# Patient Record
Sex: Male | Born: 1995 | Race: Black or African American | Hispanic: No | Marital: Single | Smoking: Current some day smoker
Health system: Southern US, Community
[De-identification: ages and names within clinical notes are randomized; demographics above are authoritative.]

---
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
## 2016-08-21 ENCOUNTER — Emergency Department (HOSPITAL_COMMUNITY)
Admission: EM | Admit: 2016-08-21 | Discharge: 2016-08-21 | Disposition: A | Discharge status: Home / Self Care | Payer: Medicaid Other | Attending: Emergency Medicine | Admitting: Emergency Medicine

## 2016-08-21 ENCOUNTER — Encounter (HOSPITAL_COMMUNITY): Payer: Self-pay

## 2016-08-21 DIAGNOSIS — J4521 Mild intermittent asthma with (acute) exacerbation: Secondary | ICD-10-CM

## 2016-08-21 DIAGNOSIS — F17290 Nicotine dependence, other tobacco product, uncomplicated: Secondary | ICD-10-CM | POA: Insufficient documentation

## 2016-08-21 MED ORDER — IPRATROPIUM BROMIDE 0.02 % IN SOLN
0.5000 mg | Freq: Once | RESPIRATORY_TRACT | Status: AC
  Administered 2016-08-21: 0.5 mg via RESPIRATORY_TRACT
  Filled 2016-08-21: qty 2.5

## 2016-08-21 MED ORDER — ALBUTEROL SULFATE (2.5 MG/3ML) 0.083% IN NEBU: 2.5000 mg | INHALATION_SOLUTION | Freq: Once | RESPIRATORY_TRACT | Status: DC

## 2016-08-21 MED ORDER — LORATADINE 10 MG PO TABS: 10.0000 mg | ORAL_TABLET | Freq: Once | ORAL | Status: DC

## 2016-08-21 MED ORDER — ALBUTEROL SULFATE (2.5 MG/3ML) 0.083% IN NEBU
5.0000 mg | INHALATION_SOLUTION | Freq: Once | RESPIRATORY_TRACT | Status: AC
  Administered 2016-08-21: 5 mg via RESPIRATORY_TRACT
  Filled 2016-08-21 (×2): qty 6

## 2016-08-21 MED ORDER — CETIRIZINE HCL 10 MG PO TABS: 10.0000 mg | ORAL_TABLET | Freq: Every day | ORAL | 1 refills | Status: AC

## 2016-08-21 MED ORDER — ALBUTEROL SULFATE HFA 108 (90 BASE) MCG/ACT IN AERS: 1.0000 | INHALATION_SPRAY | Freq: Four times a day (QID) | RESPIRATORY_TRACT | 0 refills | Status: DC | PRN

## 2016-08-21 MED ORDER — ALBUTEROL SULFATE HFA 108 (90 BASE) MCG/ACT IN AERS
2.0000 | INHALATION_SPRAY | Freq: Once | RESPIRATORY_TRACT | Status: AC
  Administered 2016-08-21: 2 via RESPIRATORY_TRACT
  Filled 2016-08-21: qty 6.7

## 2016-08-21 NOTE — ED Provider Notes (Signed)
[facility]-EMERGENCY DEPT Provider Note   CSN: [id number] Arrival date & time: 08/21/16  0436     History   Chief Complaint Chief Complaint  Patient presents with  . Asthma    HPI Oscar ShirkMark A Gentz is a 20 y.o. male.  HPI  Patient is a 20 year old male with history of asthma who presents emergency department for asthma exacerbation which began 1 hour prior to arrival in the ER. He is out of his home medication. He complained of wheezes and tightness which he feels in his back.  He feels mildly short of breath.  He has cough with clear to white sputum production has been intermittent for the past 1-2 weeks. He denies fever, chills, sweats.  His symptoms feel like an asthma exacerbation.  He states he is triggered by different seasonal allergies and sometimes has worse asthma during winter months.  No other complaints.   Past Medical History:  Diagnosis Date  . Asthma     There are no active problems to display for this patient.   History reviewed. No pertinent surgical history.     Home Medications    Prior to Admission medications   Medication Sig Start Date End Date Taking? Authorizing Provider  albuterol (PROVENTIL HFA;VENTOLIN HFA) 108 (90 Base) MCG/ACT inhaler Inhale 1-2 puffs into the lungs every 6 (six) hours as needed for wheezing or shortness of breath. 08/21/16   Danelle BerryLeisa Plumer Mittelstaedt, PA-C  albuterol (PROVENTIL) (2.5 MG/3ML) 0.083% nebulizer solution Take 2.5 mg by nebulization every 4 (four) hours as needed. For shortness of breath 01/12/12 01/11/13  Viviano SimasLauren Robinson, NP  albuterol (PROVENTIL) (2.5 MG/3ML) 0.083% nebulizer solution Take 3 mLs (2.5 mg total) by nebulization every 4 (four) hours as needed for wheezing. 08/21/12 08/21/13  Marcellina Millinimothy Galey, MD  cetirizine (ZYRTEC ALLERGY) 10 MG tablet Take 1 tablet (10 mg total) by mouth daily. 08/21/16   Danelle BerryLeisa Urban Naval, PA-C  predniSONE (DELTASONE) 10 MG tablet Take 6 tablets (60 mg total) by mouth daily. 09/16/15   Azalia BilisKevin Campos, MD    Family  History No family history on file.  Social History Social History  Substance Use Topics  . Smoking status: Current Some Day Smoker    Types: Cigars  . Smokeless tobacco: Not on file  . Alcohol use No     Allergies   Patient has no known allergies.   Review of Systems Review of Systems  All other systems reviewed and are negative.    Physical Exam Updated Vital Signs BP 141/70   Pulse 69   Temp 97.9 F (36.6 C) (Oral)   Resp 22   Ht 5\' 9"  (1.753 m)   Wt 73 kg   SpO2 99%   BMI 23.78 kg/m   Physical Exam  Constitutional: He is oriented to person, place, and time. He appears well-developed and well-nourished. No distress.  HENT:  Head: Normocephalic and atraumatic.  Right Ear: External ear normal.  Left Ear: External ear normal.  Nose: Nose normal.  Mouth/Throat: Oropharynx is clear and moist. No oropharyngeal exudate.  Eyes: Conjunctivae and EOM are normal. Pupils are equal, round, and reactive to light. Right eye exhibits no discharge. Left eye exhibits no discharge. No scleral icterus.  Neck: Normal range of motion. Neck supple.  Cardiovascular: Normal rate, regular rhythm, normal heart sounds and intact distal pulses.  Exam reveals no gallop and no friction rub.   No murmur heard. Pulmonary/Chest: Effort normal. No stridor. No respiratory distress. He has wheezes. He has no rales. He exhibits  no tenderness.  Expiratory wheeze, no rales or rhonchi, no chest wall tenderness, no retractions or accessory muscle use, no respiratory distress, patient able to speak in full and complete sentences  Abdominal: Soft. Bowel sounds are normal. He exhibits no distension and no mass. There is no tenderness. There is no rebound and no guarding.  Musculoskeletal: Normal range of motion. He exhibits no deformity.  Neurological: He is alert and oriented to person, place, and time. He has normal reflexes. He exhibits normal muscle tone. Coordination normal.  Skin: Skin is warm and  dry. Capillary refill takes less than 2 seconds. No rash noted. He is not diaphoretic. No erythema. No pallor.  Psychiatric: He has a normal mood and affect. His behavior is normal. Judgment and thought content normal.  Nursing note and vitals reviewed.    ED Treatments / Results  Labs (all labs ordered are listed, but only abnormal results are displayed) Labs Reviewed - No data to display  EKG  EKG Interpretation None       Radiology No results found.  Procedures Procedures (including critical care time)  Medications Ordered in ED Medications  albuterol (PROVENTIL) (2.5 MG/3ML) 0.083% nebulizer solution 5 mg (5 mg Nebulization Given 08/21/16 0457)  ipratropium (ATROVENT) nebulizer solution 0.5 mg (0.5 mg Nebulization Given 08/21/16 0457)  albuterol (PROVENTIL HFA;VENTOLIN HFA) 108 (90 Base) MCG/ACT inhaler 2 puff (2 puffs Inhalation Given 08/21/16 0543)     Initial Impression / Assessment and Plan / ED Course  I have reviewed the triage vital signs and the nursing notes.  Pertinent labs & imaging results that were available during my care of the patient were reviewed by me and considered in my medical decision making (see chart for details).  Clinical Course    Patient with mild signs and symptoms of asthma. Oxygen saturation is above 90%. No accessory muscle use, no cyanosis. Treated in the ED.  Patient feels improved after treatment. Will discharge with replacement albuterol inhaler and antihistamine. Pt instructed to follow up with PCP. Patient HDS. Discussed return precautions. Pt discharged home in good condition with stable vital signs.    Final Clinical Impressions(s) / ED Diagnoses   Final diagnoses:  Mild intermittent asthma with exacerbation    New Prescriptions New Prescriptions   ALBUTEROL (PROVENTIL HFA;VENTOLIN HFA) 108 (90 BASE) MCG/ACT INHALER    Inhale 1-2 puffs into the lungs every 6 (six) hours as needed for wheezing or shortness of breath.    CETIRIZINE (ZYRTEC ALLERGY) 10 MG TABLET    Take 1 tablet (10 mg total) by mouth daily.     Danelle BerryLeisa Kyen Taite, PA-C 08/21/16 [id number]    Loren Raceravid Yelverton, MD 08/21/16 0700

## 2016-08-21 NOTE — ED Triage Notes (Signed)
Pt states 1 hour ago pt started to have an asthma attack; pt states he has meds at home but ran out; pt a&o 4 on arrival; pt c/o back pain at 4/10 on arrival.

## 2016-09-02 ENCOUNTER — Encounter (HOSPITAL_COMMUNITY): Payer: Self-pay | Admitting: Emergency Medicine

## 2016-09-02 ENCOUNTER — Emergency Department (HOSPITAL_COMMUNITY)
Admission: EM | Admit: 2016-09-02 | Discharge: 2016-09-02 | Disposition: A | Discharge status: Home / Self Care | Payer: Medicaid Other | Attending: Emergency Medicine | Admitting: Emergency Medicine

## 2016-09-02 DIAGNOSIS — F17290 Nicotine dependence, other tobacco product, uncomplicated: Secondary | ICD-10-CM | POA: Insufficient documentation

## 2016-09-02 DIAGNOSIS — J4521 Mild intermittent asthma with (acute) exacerbation: Secondary | ICD-10-CM | POA: Insufficient documentation

## 2016-09-02 MED ORDER — ALBUTEROL SULFATE (2.5 MG/3ML) 0.083% IN NEBU: 2.5000 mg | INHALATION_SOLUTION | Freq: Four times a day (QID) | RESPIRATORY_TRACT | 12 refills | Status: DC | PRN

## 2016-09-02 MED ORDER — ALBUTEROL SULFATE HFA 108 (90 BASE) MCG/ACT IN AERS: 1.0000 | INHALATION_SPRAY | Freq: Four times a day (QID) | RESPIRATORY_TRACT | 0 refills | Status: DC | PRN

## 2016-09-02 MED ORDER — ALBUTEROL SULFATE (2.5 MG/3ML) 0.083% IN NEBU
5.0000 mg | INHALATION_SOLUTION | Freq: Once | RESPIRATORY_TRACT | Status: AC
  Administered 2016-09-02: 5 mg via RESPIRATORY_TRACT
  Filled 2016-09-02: qty 6

## 2016-09-02 NOTE — ED Provider Notes (Signed)
[facility]EM[facility]Y DEPT Provider Note   CSN: [id number] Arrival date & time: 09/02/16  1120  By signing my name below, I, Vista Minkobert Ross, attest that this documentation has been prepared under the direction and in the presence of [facility] IncJamie Ward PA-C.  Electronically Signed: Vista Minkobert Ross, ED Scribe. 09/02/16. 12:17 PM.   History   Chief Complaint Chief Complaint  Patient presents with  . Asthma    HPI HPI Comments: Oscar Davila is a 20 y.o. male, with Hx of asthma, who presents to the Emergency Department for an asthma flare that started last night. He was cleaning his house last night when he believes that some dust got into his nose and irritated his lungs. He reports a dry cough since this occurred and along increased difficulty catching his breath. Pt also reports associated wheezing that started just prior to arrival. He was given a breathing treatment 30 minutes ago and symptoms have subsided, states that he feels much better and is no longer having trouble breathing. Pt with Hx of asthma and usually uses albuterol inhaler and nebulizer during asthma flares but states that he has been out of these medications therefore no meds taken pta for symptoms. No fever.  The history is provided by the patient. No language interpreter was used.    Past Medical History:  Diagnosis Date  . Asthma     There are no active problems to display for this patient.   History reviewed. No pertinent surgical history.     Home Medications    Prior to Admission medications   Medication Sig Start Date End Date Taking? Authorizing Provider  albuterol (PROVENTIL HFA;VENTOLIN HFA) 108 (90 Base) MCG/ACT inhaler Inhale 1-2 puffs into the lungs every 6 (six) hours as needed for wheezing or shortness of breath. 09/02/16   Jaime Pilcher Ward, PA-C  albuterol (PROVENTIL) (2.5 MG/3ML) 0.083% nebulizer solution Take 3 mLs (2.5 mg total) by nebulization every 6 (six) hours as needed for wheezing or shortness of  breath. 09/02/16   Chase PicketJaime Pilcher Ward, PA-C  cetirizine (ZYRTEC ALLERGY) 10 MG tablet Take 1 tablet (10 mg total) by mouth daily. 08/21/16   Danelle BerryLeisa Tapia, PA-C  predniSONE (DELTASONE) 10 MG tablet Take 6 tablets (60 mg total) by mouth daily. 09/16/15   Azalia BilisKevin Campos, MD    Family History History reviewed. No pertinent family history.  Social History Social History  Substance Use Topics  . Smoking status: Current Some Day Smoker    Types: Cigars  . Smokeless tobacco: Not on file  . Alcohol use No     Allergies   Patient has no known allergies.   Review of Systems Review of Systems  Constitutional: Negative for fever.  Respiratory: Positive for cough, shortness of breath and wheezing.   All other systems reviewed and are negative.    Physical Exam Updated Vital Signs BP 141/86 (BP Location: Left Arm)   Pulse (!) 58   Temp 98.2 F (36.8 C) (Oral)   Resp 18   SpO2 98%   Physical Exam  Constitutional: He is oriented to person, place, and time. He appears well-developed and well-nourished. No distress.  HENT:  Head: Normocephalic and atraumatic.  Neck: Normal range of motion.  Cardiovascular: Normal rate, regular rhythm and normal heart sounds.   Pulmonary/Chest: Effort normal and breath sounds normal. No respiratory distress. He has no wheezes. He has no rales.  Speaking in full sentences with no distress. Lungs CTA bilaterally.   Neurological: He is alert and oriented to  person, place, and time.  Skin: Skin is warm and dry. He is not diaphoretic.  Psychiatric: He has a normal mood and affect. Judgment normal.  Nursing note and vitals reviewed.     ED Treatments / Results  DIAGNOSTIC STUDIES: Oxygen Saturation is 98% on RA, normal by my interpretation.  COORDINATION OF CARE: 12:11 PM-Will discharge. Discussed treatment plan with pt at bedside and pt agreed to plan.   Labs (all labs ordered are listed, but only abnormal results are displayed) Labs Reviewed - No  data to display  EKG  EKG Interpretation None       Radiology No results found.  Procedures Procedures (including critical care time)  Medications Ordered in ED Medications  albuterol (PROVENTIL) (2.5 MG/3ML) 0.083% nebulizer solution 5 mg (5 mg Nebulization Given 09/02/16 1133)     Initial Impression / Assessment and Plan / ED Course  I have reviewed the triage vital signs and the nursing notes.  Pertinent labs & imaging results that were available during my care of the patient were reviewed by me and considered in my medical decision making (see chart for details).  Clinical Course    MDM Number of Diagnoses or Management Options Mild intermittent asthma with exacerbation:   Oscar Davila is a 20 y.o. male who presents to ED for wheezing c/w prior asthma exacerbations requesting refill of home albuterol inhaler and nebs. Duoneb given prior to arrival and lungs CTA bilaterally on exam. Patient states shortness of breath has now resolved and he feels ready for discharge. Refill of inhaler and neb given. PCP follow up strongly encouraged for management of asthma and [facility] information given. Return precautions discussed and all questions answered.    Final Clinical Impressions(s) / ED Diagnoses   Final diagnoses:  Mild intermittent asthma with exacerbation    New Prescriptions Discharge Medication List as of 09/02/2016 12:12 PM     I personally performed the services described in this documentation, which was scribed in my presence. The recorded information has been reviewed and is accurate.    [facility] HospitalJaime Pilcher Ward, PA-C 09/02/16 1638    Marily MemosJason Mesner, MD 09/05/16 [phone]

## 2016-09-02 NOTE — ED Triage Notes (Signed)
Pt here for asthma sts out of meds x 3 days

## 2016-09-02 NOTE — Discharge Instructions (Signed)
Please follow up with a primary care provider for long term management of your asthma.  I have refilled your home albuterol inhaler and nebulizer medication.  Return to ER for difficulty breathing, new or worsening symptoms, any additional concerns.

## 2017-01-09 ENCOUNTER — Encounter (HOSPITAL_COMMUNITY): Payer: Self-pay | Admitting: *Deleted

## 2017-01-09 ENCOUNTER — Emergency Department (HOSPITAL_COMMUNITY)
Admission: EM | Admit: 2017-01-09 | Discharge: 2017-01-09 | Disposition: A | Discharge status: Home / Self Care | Payer: Medicaid Other | Attending: Emergency Medicine | Admitting: Emergency Medicine

## 2017-01-09 DIAGNOSIS — J4521 Mild intermittent asthma with (acute) exacerbation: Secondary | ICD-10-CM | POA: Diagnosis not present

## 2017-01-09 DIAGNOSIS — R0602 Shortness of breath: Secondary | ICD-10-CM | POA: Diagnosis present

## 2017-01-09 DIAGNOSIS — F17290 Nicotine dependence, other tobacco product, uncomplicated: Secondary | ICD-10-CM | POA: Insufficient documentation

## 2017-01-09 MED ORDER — ALBUTEROL SULFATE HFA 108 (90 BASE) MCG/ACT IN AERS: 1.0000 | INHALATION_SPRAY | Freq: Four times a day (QID) | RESPIRATORY_TRACT | 0 refills | Status: AC | PRN

## 2017-01-09 MED ORDER — PREDNISONE 20 MG PO TABS
40.0000 mg | ORAL_TABLET | Freq: Once | ORAL | Status: AC
  Administered 2017-01-09: 40 mg via ORAL
  Filled 2017-01-09: qty 2

## 2017-01-09 MED ORDER — PREDNISONE 10 MG PO TABS: 40.0000 mg | ORAL_TABLET | Freq: Every day | ORAL | 0 refills | Status: AC

## 2017-01-09 MED ORDER — LORATADINE 10 MG PO TABS: 10.0000 mg | ORAL_TABLET | Freq: Every day | ORAL | 0 refills | Status: AC

## 2017-01-09 MED ORDER — IPRATROPIUM-ALBUTEROL 0.5-2.5 (3) MG/3ML IN SOLN
RESPIRATORY_TRACT | Status: AC
  Filled 2017-01-09: qty 3

## 2017-01-09 MED ORDER — IPRATROPIUM-ALBUTEROL 0.5-2.5 (3) MG/3ML IN SOLN
3.0000 mL | Freq: Once | RESPIRATORY_TRACT | Status: AC
  Administered 2017-01-09: 3 mL via RESPIRATORY_TRACT

## 2017-01-09 MED ORDER — ALBUTEROL SULFATE HFA 108 (90 BASE) MCG/ACT IN AERS
1.0000 | INHALATION_SPRAY | Freq: Once | RESPIRATORY_TRACT | Status: AC
  Administered 2017-01-09: 1 via RESPIRATORY_TRACT
  Filled 2017-01-09: qty 6.7

## 2017-01-09 NOTE — ED Provider Notes (Signed)
[facility] Provider Note   CSN: [id number] Arrival date & time: 01/09/17  1716   By signing my name below, I, Clovis Pu, attest that this documentation has been prepared under the direction and in the presence of  [employer]. Electronically Signed: Clovis Pu, ED Scribe. 01/09/17. 6:41 PM.   History   Chief Complaint Chief Complaint  Patient presents with  . Asthma    HPI Comments:  Oscar Davila is a 21 y.o. male, with a PMHx of asthma and seasonal allergies, who presents to the Emergency Department complaining of acute onset SOB, chest tightness and cough x today. He also reports he recently had a cold which has since started to improve. His last asthma exacerbation was in 08/2016 and pt notes he was last admitted to the hospilized for asthma when he was a child. Per chart review, pt was last seen in the ED for an asthma exacerbation on 09/02/2016. He has not used his albuterol inhaler for his symptoms due to running out of this medication. Pt denies current chest tightness, rhinorrhea, sore throat or any other associated symptoms. Pt also denies a hx of being intubated due to as asthma exacerbation. Patient received breathing tx prior to evaluation, patient reports complete resolution of symptoms.   The history is provided by the patient. No language interpreter was used.    Past Medical History:  Diagnosis Date  . Asthma     There are no active problems to display for this patient.   History reviewed. No pertinent surgical history.     Home Medications    Prior to Admission medications   Medication Sig Start Date End Date Taking? Authorizing Provider  albuterol (PROVENTIL HFA;VENTOLIN HFA) 108 (90 Base) MCG/ACT inhaler Inhale 1-2 puffs into the lungs every 6 (six) hours as needed for wheezing or shortness of breath. 01/09/17   Liberty Handy, PA-C  cetirizine (ZYRTEC ALLERGY) 10 MG tablet Take 1 tablet (10 mg total) by mouth daily. 08/21/16   Danelle Berry, PA-C  loratadine (CLARITIN) 10 MG tablet Take 1 tablet (10 mg total) by mouth daily. 01/09/17   Liberty Handy, PA-C  predniSONE (DELTASONE) 10 MG tablet Take 4 tablets (40 mg total) by mouth daily. 01/09/17 01/14/17  Liberty Handy, PA-C    Family History No family history on file.  Social History Social History  Substance Use Topics  . Smoking status: Current Some Day Smoker    Types: Cigars  . Smokeless tobacco: Not on file  . Alcohol use No     Allergies   Patient has no known allergies.   Review of Systems Review of Systems  Constitutional: Negative for fever.  HENT: Negative for rhinorrhea and sore throat.   Respiratory: Positive for cough, chest tightness and shortness of breath.   Cardiovascular: Negative for chest pain.  All other systems reviewed and are negative.    Physical Exam Updated Vital Signs BP 127/85 (BP Location: Right Arm)   Pulse 88   Temp 98.2 F (36.8 C) (Oral)   Resp 18   SpO2 98%   Physical Exam  Constitutional: He is oriented to person, place, and time. He appears well-developed and well-nourished. No distress.  HENT:  Head: Normocephalic and atraumatic.  Eyes: Conjunctivae and EOM are normal.  Neck: Normal range of motion.  Cardiovascular: Normal rate, regular rhythm, normal heart sounds and intact distal pulses.   Pulmonary/Chest:  RR within normal limits. SpO2 within normal limits.  Normal breathing effort.  Patient speaking in full sentences. No pursed lip breathing. No chest wall retractions. No cyanosis. Chest wall expansion symmetric.  No chest wall tenderness. Lungs CTAB anteriorly and posteriorly without wheezing, rhonchi or crackles.  No egophony.   Abdominal: Soft. He exhibits no distension. There is no tenderness.  Musculoskeletal: Normal range of motion.  Neurological: He is alert and oriented to person, place, and time.  Skin: Skin is warm and dry.  Psychiatric: He has a normal mood and affect. Judgment normal.    Nursing note and vitals reviewed.  ED Treatments / Results  DIAGNOSTIC STUDIES:  Oxygen Saturation is 98% on RA, normal by my interpretation.    COORDINATION OF CARE:  6:36 PM Will administer breathing treatment. Discussed treatment plan with pt at bedside and pt agreed to plan.  Labs (all labs ordered are listed, but only abnormal results are displayed) Labs Reviewed - No data to display  EKG  EKG Interpretation None       Radiology No results found.  Procedures Procedures (including critical care time)  Medications Ordered in ED Medications  ipratropium-albuterol (DUONEB) 0.5-2.5 (3) MG/3ML nebulizer solution (not administered)  predniSONE (DELTASONE) tablet 40 mg (not administered)  albuterol (PROVENTIL HFA;VENTOLIN HFA) 108 (90 Base) MCG/ACT inhaler 1 puff (not administered)  ipratropium-albuterol (DUONEB) 0.5-2.5 (3) MG/3ML nebulizer solution 3 mL (3 mLs Nebulization Given 01/09/17 1740)     Initial Impression / Assessment and Plan / ED Course  I have reviewed the triage vital signs and the nursing notes.  Pertinent labs & imaging results that were available during my care of the patient were reviewed by me and considered in my medical decision making (see chart for details).     Patient with signs and symptoms of asthma exacerbation.  Patient notes these are his typical asthma flare symptoms.  Patient ran out of his albuterol inhaler which is why he came to ED.   Patient notes preceding URI and seasonal allergies, suspect these two triggered flare today. Patient given breathing tx prior to examination with complete resolution of symptoms.  Oxygen saturation is above 90%. No accessory muscle use, no cyanosis. Prednisone given in ED.  Patient feels improved after treatment. Will discharge with prednisone, claritin and albuterol inhaler. Pt instructed to follow up with PCP. Patient is hemodynamically stable. Discussed return precautions. Pt appears safe for discharge.      Final Clinical Impressions(s) / ED Diagnoses   Final diagnoses:  Mild intermittent asthma with exacerbation    New Prescriptions New Prescriptions   ALBUTEROL (PROVENTIL HFA;VENTOLIN HFA) 108 (90 BASE) MCG/ACT INHALER    Inhale 1-2 puffs into the lungs every 6 (six) hours as needed for wheezing or shortness of breath.   LORATADINE (CLARITIN) 10 MG TABLET    Take 1 tablet (10 mg total) by mouth daily.   PREDNISONE (DELTASONE) 10 MG TABLET    Take 4 tablets (40 mg total) by mouth daily.   I personally performed the services described in this documentation, which was scribed in my presence. The recorded information has been reviewed and is accurate.     Liberty Handy, PA-C 01/09/17 1858    Lavera Guise, MD 01/09/17 1902

## 2017-01-09 NOTE — ED Notes (Signed)
ED Provider at bedside. 

## 2017-01-09 NOTE — ED Notes (Signed)
Wheezing decreased after treatment

## 2017-01-09 NOTE — ED Triage Notes (Signed)
Pt reports waking this morning with his asthma bothering him. Pt reports using his inhaler at home with no relief. Pt noted to have inspiratory and expiratory wheezing in triage.

## 2017-01-09 NOTE — Discharge Instructions (Signed)
You were treated with one round of breathing treatment which completely resolved your symptoms.  I suspect your asthma flare today was due to your recent upper respiratory illness and possibly weather change and pollen.   Please take prednisone as prescribed.  You should take an allergy medication daily, especially at this time of the year as you are more prone to allergy induced asthma flares.  Take claritin as prescribed.  Use albuterol inhaler as needed for chest tightness from asthma.    Return to the ED if your breathing worsens or does not respond to albuterol.  Please follow up with your primary care provider for future albuterol inhaler refills and prescriptions.

## 2017-05-20 ENCOUNTER — Encounter (HOSPITAL_COMMUNITY): Payer: Self-pay | Admitting: Emergency Medicine

## 2017-05-20 ENCOUNTER — Emergency Department (HOSPITAL_COMMUNITY): Payer: Medicaid Other

## 2017-05-20 ENCOUNTER — Emergency Department (HOSPITAL_COMMUNITY)
Admission: EM | Admit: 2017-05-20 | Discharge: 2017-05-20 | Disposition: A | Discharge status: Home / Self Care | Payer: Medicaid Other | Attending: Emergency Medicine | Admitting: Emergency Medicine

## 2017-05-20 DIAGNOSIS — F17210 Nicotine dependence, cigarettes, uncomplicated: Secondary | ICD-10-CM | POA: Diagnosis not present

## 2017-05-20 DIAGNOSIS — R400 Somnolence: Secondary | ICD-10-CM | POA: Diagnosis not present

## 2017-05-20 DIAGNOSIS — J45909 Unspecified asthma, uncomplicated: Secondary | ICD-10-CM | POA: Diagnosis not present

## 2017-05-20 DIAGNOSIS — R4182 Altered mental status, unspecified: Secondary | ICD-10-CM | POA: Diagnosis present

## 2017-05-20 DIAGNOSIS — Z79899 Other long term (current) drug therapy: Secondary | ICD-10-CM | POA: Insufficient documentation

## 2017-05-20 LAB — CBC
HEMATOCRIT: 44.3 % (ref 39.0–52.0)
HEMOGLOBIN: 14.5 g/dL (ref 13.0–17.0)
MCH: 25.8 pg — ABNORMAL LOW (ref 26.0–34.0)
MCHC: 32.7 g/dL (ref 30.0–36.0)
MCV: 78.8 fL (ref 78.0–100.0)
Platelets: 258 10*3/uL (ref 150–400)
RBC: 5.62 MIL/uL (ref 4.22–5.81)
RDW: 15 % (ref 11.5–15.5)
WBC: 6.3 10*3/uL (ref 4.0–10.5)

## 2017-05-20 LAB — ETHANOL: Alcohol, Ethyl (B): <5 mg/dL (ref <5)

## 2017-05-20 LAB — COMPREHENSIVE METABOLIC PANEL
ALBUMIN: 4 g/dL (ref 3.5–5.0)
ALK PHOS: 71 U/L (ref 38–126)
ALT: 14 U/L — ABNORMAL LOW (ref 17–63)
ANION GAP: 8 (ref 5–15)
AST: 22 U/L (ref 15–41)
BILIRUBIN TOTAL: 0.7 mg/dL (ref 0.3–1.2)
BUN: 11 mg/dL (ref 6–20)
CALCIUM: 9.6 mg/dL (ref 8.9–10.3)
CO2: 25 mmol/L (ref 22–32)
CREATININE: 0.91 mg/dL (ref 0.61–1.24)
Chloride: 107 mmol/L (ref 101–111)
GFR calc Af Amer: >60 mL/min (ref >60)
GFR calc non Af Amer: >60 mL/min (ref >60)
GLUCOSE: 102 mg/dL — AB (ref 65–99)
Potassium: 3.9 mmol/L (ref 3.5–5.1)
Sodium: 140 mmol/L (ref 135–145)
TOTAL PROTEIN: 6.7 g/dL (ref 6.5–8.1)

## 2017-05-20 NOTE — ED Notes (Signed)
Attempted to get pt to stand and void, pt falling asleep while standing, pt not following commands well.

## 2017-05-20 NOTE — ED Triage Notes (Signed)
LSN 1400- pt's friend woke up him to go to work, pt was acting "goofy- not his norm." Pt told his friend he wanted to take a nap in the car before going into work. Pt's friend couldn't arouse him when he went to check on him. EMS arrived, pt drooling on himself- mouthing words without speaking out loud per EMS. Denies alcohol, drugs. Denies pain. No other complaints. BP 140/90, CBG 88, HR 80 sinus rhythm, 100% on room air.

## 2017-05-20 NOTE — ED Notes (Signed)
Pt ambulated in room with steady gait  

## 2017-05-20 NOTE — ED Notes (Signed)
Reminded pt that he needs to provide urine sample

## 2017-05-20 NOTE — ED Notes (Signed)
Awaiting brother to pick up patient

## 2017-05-20 NOTE — Discharge Instructions (Signed)
It was our pleasure to provide your ER care today - we hope that you feel better.  Get adequate rest.  Follow up with primary care doctor in the coming week.  Return to ER if worse, new symptoms, fevers, new or severe pain, trouble breathing, other concern.

## 2017-05-20 NOTE — ED Provider Notes (Signed)
[facility] Provider Note   CSN: [id number] Arrival date & time: 05/20/17  1755     History   Chief Complaint Chief Complaint  Patient presents with  . Altered Mental Status    HPI Oscar Davila is a 21 y.o. male.  Patient with hx asthma, noted to be acting goofy by friend on way to work today. Per report, was difficult to arouse and keep awake. Pt very poorly cooperative w hx - level 5 caveat.  Patient denies any pain, no headaches, no chest pain. Denies sob. No fever/chills. Denies trauma/fall. Does say slept only 2-3 hours last night.  Denies alcohol/drug use.    The history is provided by the patient and the EMS personnel. The history is limited by the condition of the patient.  Altered Mental Status      Past Medical History:  Diagnosis Date  . Asthma     There are no active problems to display for this patient.   No past surgical history on file.     Home Medications    Prior to Admission medications   Medication Sig Start Date End Date Taking? Authorizing Provider  albuterol (PROVENTIL HFA;VENTOLIN HFA) 108 (90 Base) MCG/ACT inhaler Inhale 1-2 puffs into the lungs every 6 (six) hours as needed for wheezing or shortness of breath. 01/09/17   Liberty Handy, PA-C  cetirizine (ZYRTEC ALLERGY) 10 MG tablet Take 1 tablet (10 mg total) by mouth daily. 08/21/16   Danelle Berry, PA-C  loratadine (CLARITIN) 10 MG tablet Take 1 tablet (10 mg total) by mouth daily. 01/09/17   Liberty Handy, PA-C    Family History No family history on file.  Social History Social History  Substance Use Topics  . Smoking status: Current Some Day Smoker    Types: Cigars  . Smokeless tobacco: Not on file  . Alcohol use No     Allergies   Patient has no known allergies.   Review of Systems Review of Systems  Constitutional: Negative for fever.  HENT: Negative for sore throat.   Eyes: Negative for visual disturbance.  Respiratory: Negative for shortness of  breath.   Cardiovascular: Negative for chest pain.  Gastrointestinal: Negative for abdominal pain and vomiting.  Genitourinary: Negative for flank pain.  Musculoskeletal: Negative for neck pain and neck stiffness.  Skin: Negative for rash.  Neurological: Negative for headaches.  Hematological: Does not bruise/bleed easily.  Psychiatric/Behavioral: The patient is not nervous/anxious.      Physical Exam Updated Vital Signs There were no vitals taken for this visit.  Physical Exam  Constitutional: He appears well-developed and well-nourished. No distress.  Pt very drowsy, initially poorly responsive to history.   HENT:  Head: Atraumatic.  Mouth/Throat: Oropharynx is clear and moist.  Eyes: Conjunctivae are normal.  Neck: Neck supple. No tracheal deviation present. No thyromegaly present.  No stiffness or rigidity  Cardiovascular: Normal rate, regular rhythm, normal heart sounds and intact distal pulses.  Exam reveals no gallop and no friction rub.   No murmur heard. Pulmonary/Chest: Effort normal and breath sounds normal. No accessory muscle usage. No respiratory distress.  Abdominal: Soft. Bowel sounds are normal. He exhibits no distension. There is no tenderness.  Genitourinary:  Genitourinary Comments: No cva tenderness  Musculoskeletal: He exhibits no edema.  Neurological:  Drowsy, easily aroused. Moves bil extremities purposefully. Speech quiet, but fluent/clear. Motor intact bil. sens grossly intact.   Skin: Skin is warm and dry. No rash noted.  Psychiatric:  Slow to  respond.   Nursing note and vitals reviewed.    ED Treatments / Results  Labs (all labs ordered are listed, but only abnormal results are displayed) Results for orders placed or performed during the hospital encounter of 05/20/17  CBC  Result Value Ref Range   WBC 6.3 4.0 - 10.5 K/uL   RBC 5.62 4.22 - 5.81 MIL/uL   Hemoglobin 14.5 13.0 - 17.0 g/dL   HCT 16.1 09.6 - 04.5 %   MCV 78.8 78.0 - 100.0 fL     MCH 25.8 (L) 26.0 - 34.0 pg   MCHC 32.7 30.0 - 36.0 g/dL   RDW 40.9 81.1 - 91.4 %   Platelets 258 150 - 400 K/uL  Comprehensive metabolic panel  Result Value Ref Range   Sodium 140 135 - 145 mmol/L   Potassium 3.9 3.5 - 5.1 mmol/L   Chloride 107 101 - 111 mmol/L   CO2 25 22 - 32 mmol/L   Glucose, Bld 102 (H) 65 - 99 mg/dL   BUN 11 6 - 20 mg/dL   Creatinine, Ser 7.82 0.61 - 1.24 mg/dL   Calcium 9.6 8.9 - 95.6 mg/dL   Total Protein 6.7 6.5 - 8.1 g/dL   Albumin 4.0 3.5 - 5.0 g/dL   AST 22 15 - 41 U/L   ALT 14 (L) 17 - 63 U/L   Alkaline Phosphatase 71 38 - 126 U/L   Total Bilirubin 0.7 0.3 - 1.2 mg/dL   GFR calc non Af Amer >60 >60 mL/min   GFR calc Af Amer >60 >60 mL/min   Anion gap 8 5 - 15  Ethanol  Result Value Ref Range   Alcohol, Ethyl (B) <5 <5 mg/dL   Ct Head Wo Contrast  Result Date: 05/20/2017 CLINICAL DATA:  Altered mental status and speech difficulty. EXAM: CT HEAD WITHOUT CONTRAST TECHNIQUE: Contiguous axial images were obtained from the base of the skull through the vertex without intravenous contrast. COMPARISON:  None. FINDINGS: Brain: No evidence of acute infarction, hemorrhage, hydrocephalus, extra-axial collection or mass lesion/mass effect. Vascular: No hyperdense vessel or unexpected calcification. Skull: Normal. Negative for fracture or focal lesion. Sinuses/Orbits: No acute finding. Other: None. IMPRESSION: Normal head CT. Electronically Signed   By: Irish Lack M.D.   On: 05/20/2017 19:07    EKG  EKG Interpretation None       Radiology Ct Head Wo Contrast  Result Date: 05/20/2017 CLINICAL DATA:  Altered mental status and speech difficulty. EXAM: CT HEAD WITHOUT CONTRAST TECHNIQUE: Contiguous axial images were obtained from the base of the skull through the vertex without intravenous contrast. COMPARISON:  None. FINDINGS: Brain: No evidence of acute infarction, hemorrhage, hydrocephalus, extra-axial collection or mass lesion/mass effect. Vascular:  No hyperdense vessel or unexpected calcification. Skull: Normal. Negative for fracture or focal lesion. Sinuses/Orbits: No acute finding. Other: None. IMPRESSION: Normal head CT. Electronically Signed   By: Irish Lack M.D.   On: 05/20/2017 19:07    Procedures Procedures (including critical care time)  Medications Ordered in ED Medications - No data to display   Initial Impression / Assessment and Plan / ED Course  I have reviewed the triage vital signs and the nursing notes.  Pertinent labs & imaging results that were available during my care of the patient were reviewed by me and considered in my medical decision making (see chart for details).  Pt poorly responsive.   Reviewed nursing notes and prior charts for additional history.   Recheck - pt tolerating po fluids.  Ambulates in ED.  Pt requests d/c to home.  Pt currently asymptomatic, and appears stable for d/c.     Final Clinical Impressions(s) / ED Diagnoses   Final diagnoses:  None    New Prescriptions New Prescriptions   No medications on file     Cathren LaineSteinl, Taylen Wendland, MD 05/20/17 2028

## 2018-08-29 IMAGING — CT CT HEAD W/O CM
3 series · 15 of 47 positions shown, 18 images · non-contrast
Comparison: None.

CLINICAL DATA: Altered mental status and speech difficulty.

EXAM:
CT HEAD WITHOUT CONTRAST
TECHNIQUE: Contiguous axial images were obtained from the base of the skull
through the vertex without intravenous contrast.

[Series 3: head 5.0 h30s · axial · 0.44mm/px · z∈[-129,-4]mm · 9 of 31 slices shown, 12 images]
[im 3/31  brain]
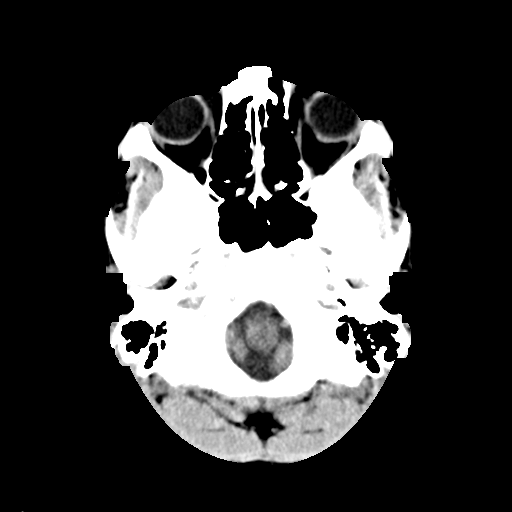
[im 3/31  bone]
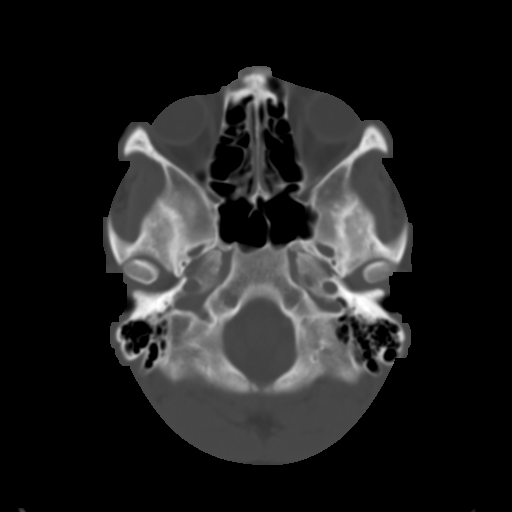
[im 6/31  brain]
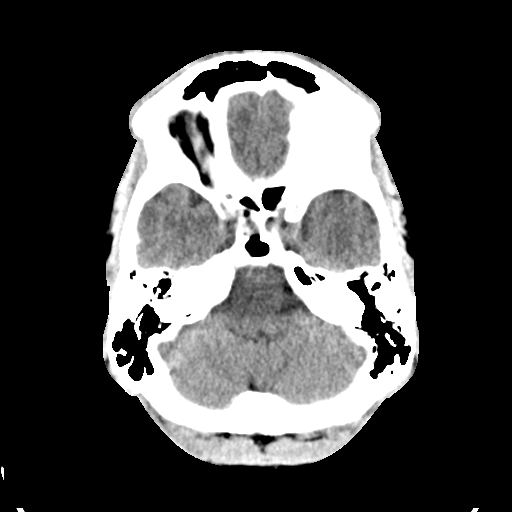
[im 9/31  brain]
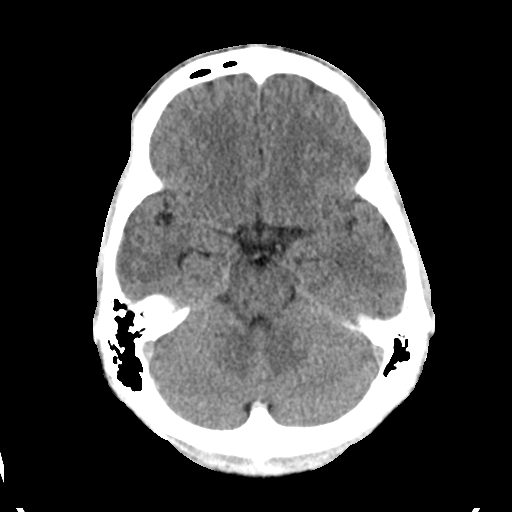
[im 12/31  brain]
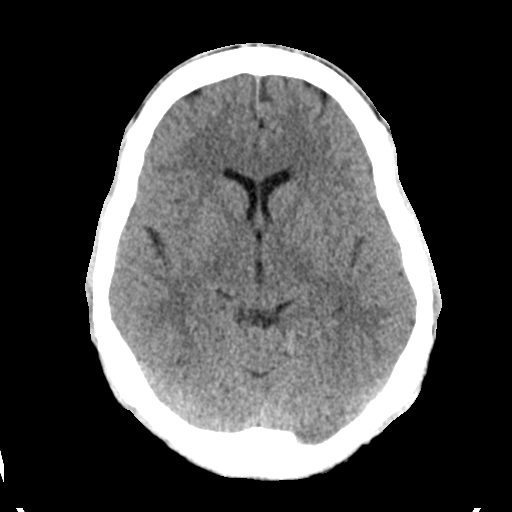
[im 16/31  brain]
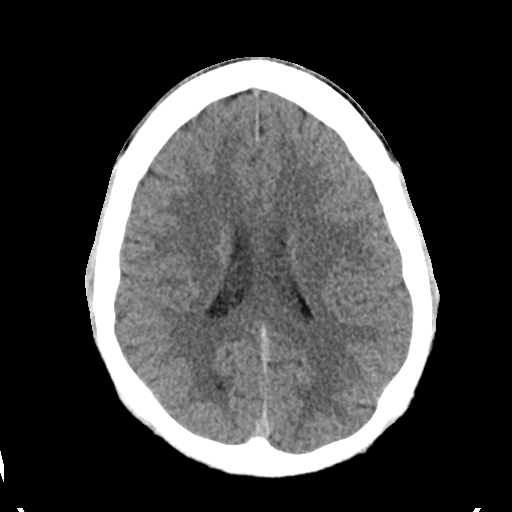
[im 16/31  bone]
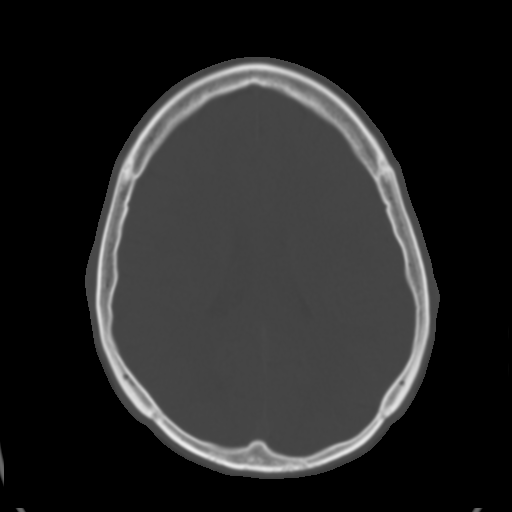
[im 19/31  brain]
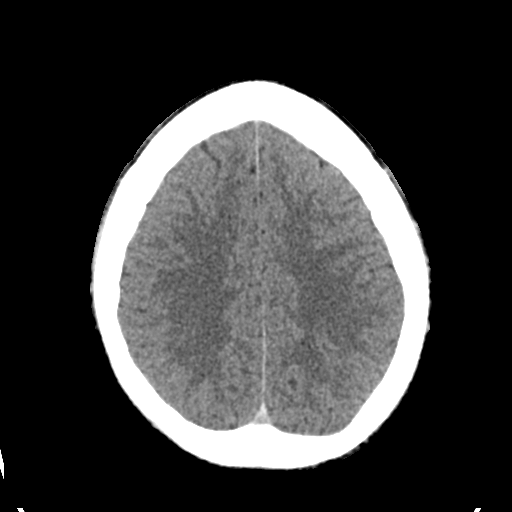
[im 22/31  brain]
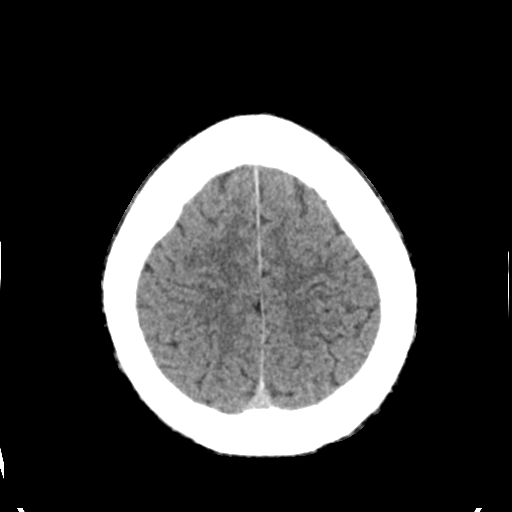
[im 25/31  brain]
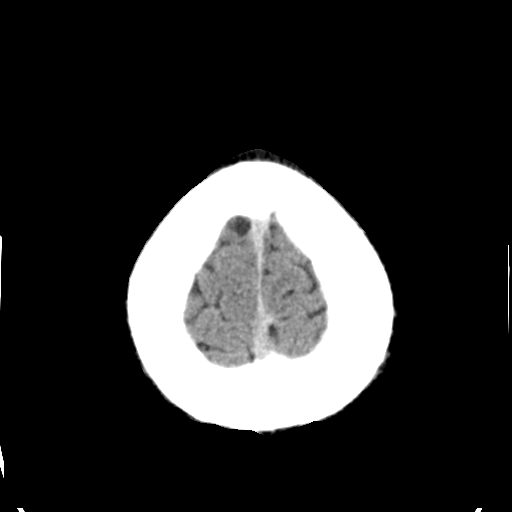
[im 28/31  brain]
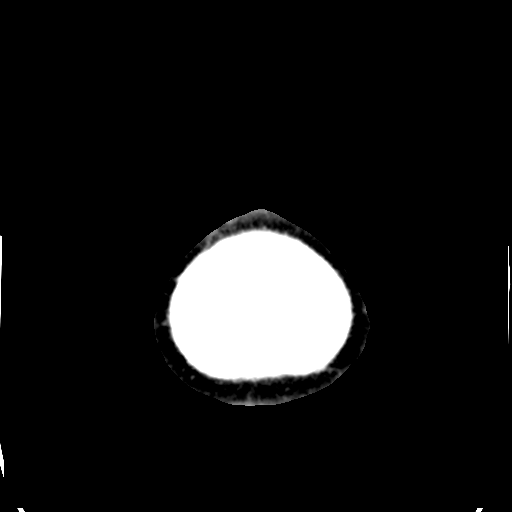
[im 28/31  bone]
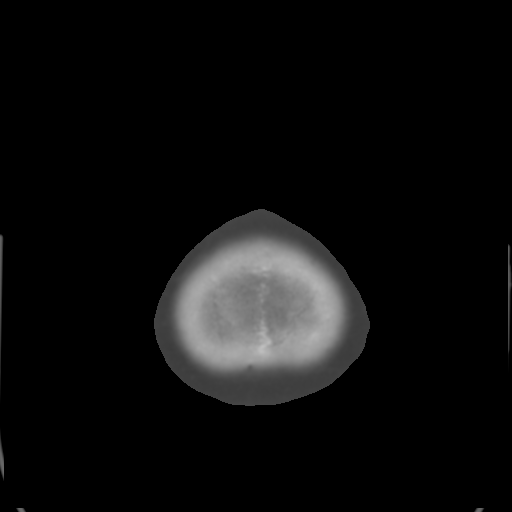

[Series 5: head 3.0 mpr cor · coronal · 0.31mm/px · 3 of 70 slices shown]
[im 24/70  brain]
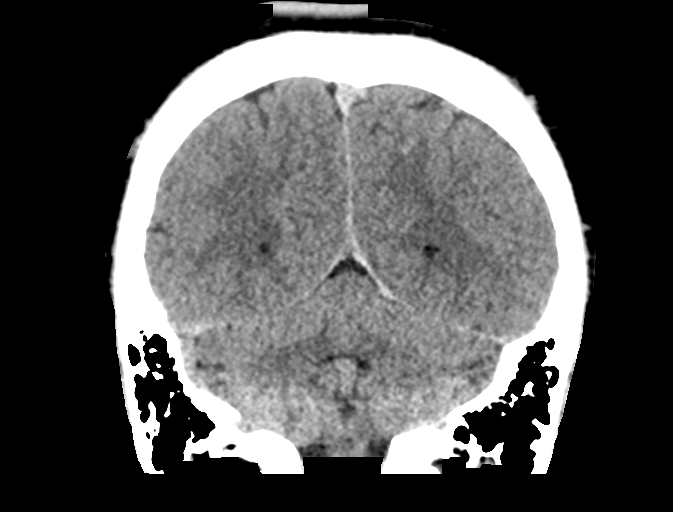
[im 31/70  brain]
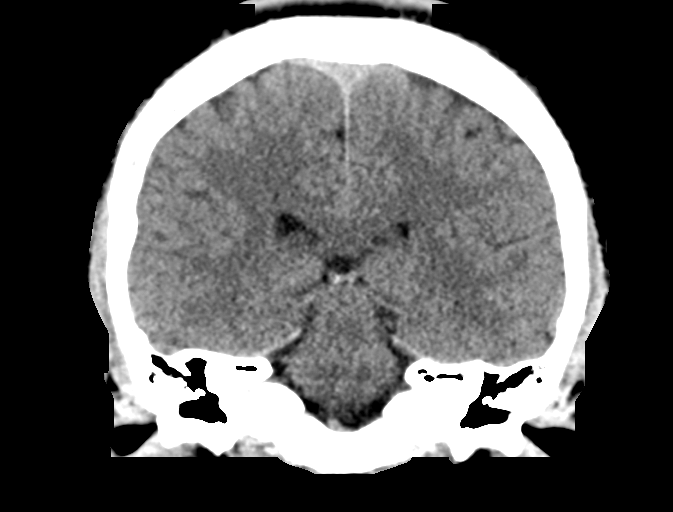
[im 39/70  brain]
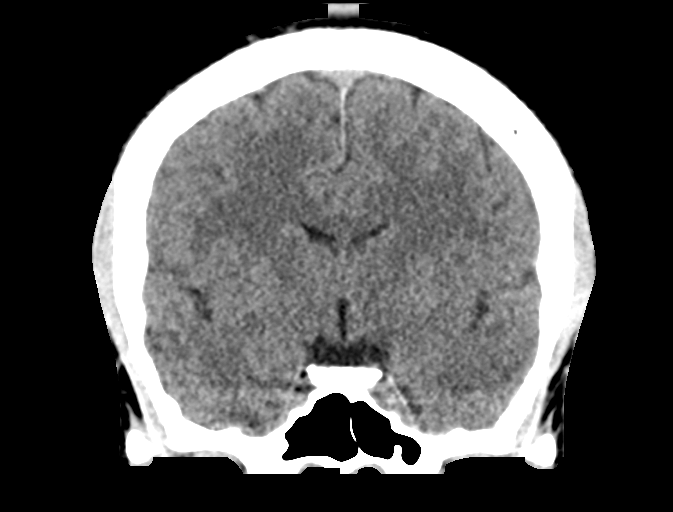

[Series 6: head 3.0 mpr sag · sagittal · 0.33mm/px · 3 of 57 slices shown]
[im 19/57  brain]
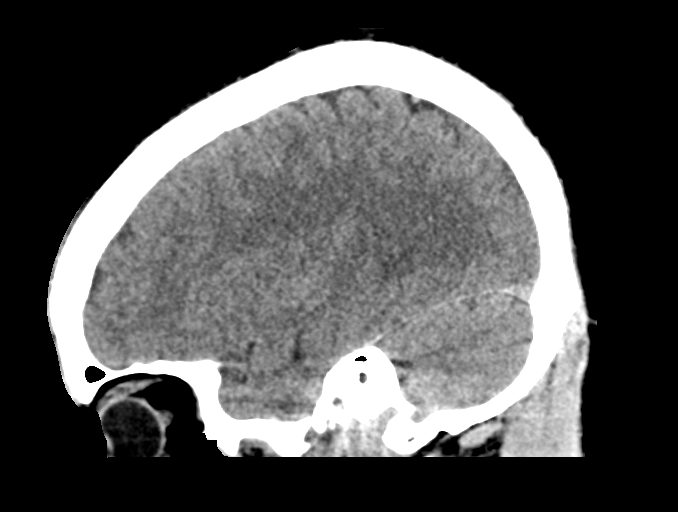
[im 29/57  brain]
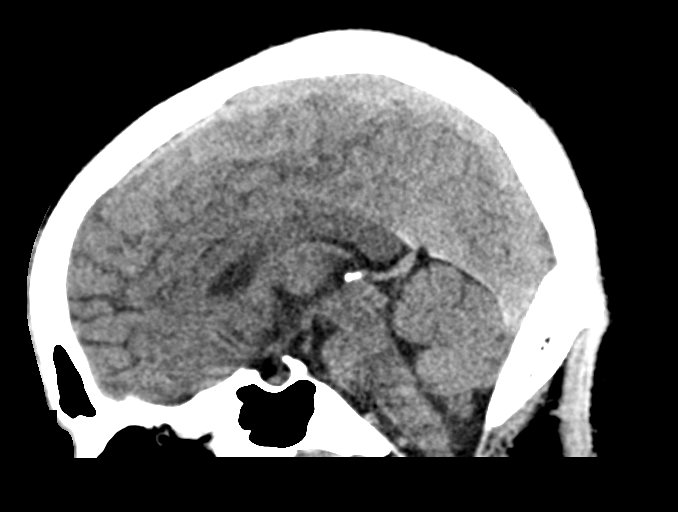
[im 38/57  brain]
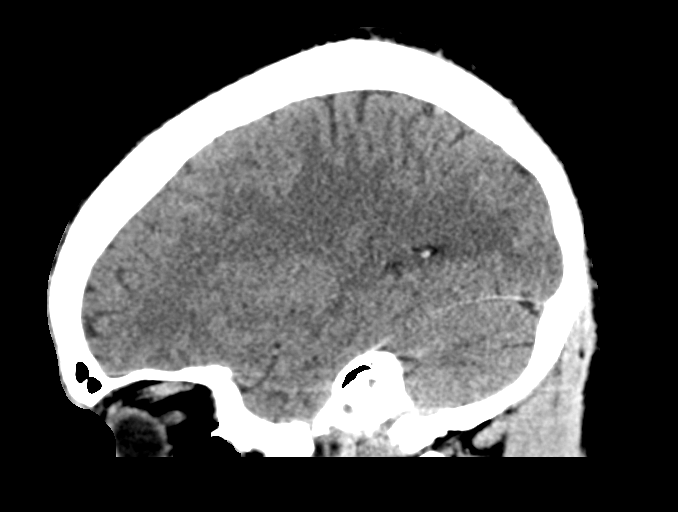

[15 of 47 positions shown; findings below may reference images not displayed]

FINDINGS: Brain: No evidence of acute infarction, hemorrhage, hydrocephalus,
extra-axial collection or mass lesion/mass effect.

Vascular: No hyperdense vessel or unexpected calcification.

Skull: Normal. Negative for fracture or focal lesion.

Sinuses/Orbits: No acute finding.

Other: None.
IMPRESSION: Normal head CT.

## 2022-07-18 ENCOUNTER — Encounter (HOSPITAL_COMMUNITY): Payer: Self-pay | Admitting: Emergency Medicine

## 2022-07-18 ENCOUNTER — Emergency Department (HOSPITAL_COMMUNITY)
Admission: EM | Admit: 2022-07-18 | Discharge: 2022-07-19 | Discharge status: Against Medical Advice | Payer: Self-pay | Attending: Student | Admitting: Student

## 2022-07-18 ENCOUNTER — Emergency Department (HOSPITAL_COMMUNITY): Payer: Medicaid Other

## 2022-07-18 ENCOUNTER — Other Ambulatory Visit: Payer: Self-pay

## 2022-07-18 DIAGNOSIS — F17210 Nicotine dependence, cigarettes, uncomplicated: Secondary | ICD-10-CM | POA: Insufficient documentation

## 2022-07-18 DIAGNOSIS — Z5321 Procedure and treatment not carried out due to patient leaving prior to being seen by health care provider: Secondary | ICD-10-CM | POA: Insufficient documentation

## 2022-07-18 DIAGNOSIS — R079 Chest pain, unspecified: Secondary | ICD-10-CM | POA: Insufficient documentation

## 2022-07-18 LAB — CBC
HCT: 42.7 % (ref 39.0–52.0)
Hemoglobin: 13.6 g/dL (ref 13.0–17.0)
MCH: 26.9 pg (ref 26.0–34.0)
MCHC: 31.9 g/dL (ref 30.0–36.0)
MCV: 84.4 fL (ref 80.0–100.0)
Platelets: 295 10*3/uL (ref 150–400)
RBC: 5.06 MIL/uL (ref 4.22–5.81)
RDW: 13.8 % (ref 11.5–15.5)
WBC: 7.6 10*3/uL (ref 4.0–10.5)
nRBC: 0 % (ref 0.0–0.2)

## 2022-07-18 LAB — BASIC METABOLIC PANEL
Anion gap: 9 (ref 5–15)
BUN: 9 mg/dL (ref 6–20)
CO2: 21 mmol/L — ABNORMAL LOW (ref 22–32)
Calcium: 9.2 mg/dL (ref 8.9–10.3)
Chloride: 107 mmol/L (ref 98–111)
Creatinine, Ser: 0.85 mg/dL (ref 0.61–1.24)
GFR, Estimated: >60 mL/min (ref >60)
Glucose, Bld: 90 mg/dL (ref 70–99)
Potassium: 4.3 mmol/L (ref 3.5–5.1)
Sodium: 137 mmol/L (ref 135–145)

## 2022-07-18 LAB — TROPONIN I (HIGH SENSITIVITY): Troponin I (High Sensitivity): 3 ng/L (ref <18)

## 2022-07-18 NOTE — ED Triage Notes (Signed)
Pt here from home with c/o chest  pain along with some sob , no n/v

## 2022-07-18 NOTE — ED Provider Triage Note (Signed)
Emergency Medicine Provider Triage Evaluation Note  Oscar Davila , a 26 y.o. male  was evaluated in triage.  Pt complains of chest pain.  Started yesterday, feels dull.  It is constant, denies any pleurisy, recent travel or surgeries, history of ACS or PE.  He does smoke cigarettes..  Review of Systems  Per HPI  Physical Exam  BP (!) 143/90 (BP Location: Right Arm)   Pulse 72   Temp 98.2 F (36.8 C) (Oral)   Resp 16   SpO2 100%  Gen:   Awake, no distress   Resp:  Normal effort  MSK:   Moves extremities without difficulty  Other:  S1-S2 upper and lower extremity pulses 2+ symmetric  Medical Decision Making  Medically screening exam initiated at 6:41 PM.  Appropriate orders placed.  GEORGE HAGGART was informed that the remainder of the evaluation will be completed by another provider, this initial triage assessment does not replace that evaluation, and the importance of remaining in the ED until their evaluation is complete.     Sherrill Raring, PA-C 07/18/22 1842

## 2022-07-19 NOTE — ED Notes (Signed)
MISSTAKING CLICK OFF  [email or web address].DID NOT GET ANY BLOOD OFF THIS PATIENT,CALLED PATIENT X3 NO ANSWER WHEN CALL FOR LAB DRAW.

## 2022-07-19 NOTE — ED Notes (Signed)
CALLED PATIENT FOR BLOOD DRAW NO ANSWER X3

## 2022-07-19 NOTE — ED Notes (Signed)
NA x2 for vitals 

## 2023-06-04 ENCOUNTER — Other Ambulatory Visit: Payer: Self-pay

## 2023-06-04 ENCOUNTER — Encounter (HOSPITAL_COMMUNITY): Payer: Self-pay

## 2023-06-04 ENCOUNTER — Emergency Department (HOSPITAL_COMMUNITY)
Admission: EM | Admit: 2023-06-04 | Discharge: 2023-06-04 | Disposition: A | Discharge status: Home / Self Care | Payer: Medicaid Other | Attending: Emergency Medicine | Admitting: Emergency Medicine

## 2023-06-04 DIAGNOSIS — S60032A Contusion of left middle finger without damage to nail, initial encounter: Secondary | ICD-10-CM

## 2023-06-04 DIAGNOSIS — X58XXXA Exposure to other specified factors, initial encounter: Secondary | ICD-10-CM | POA: Insufficient documentation

## 2023-06-04 MED ORDER — CEPHALEXIN 500 MG PO CAPS: 500.0000 mg | ORAL_CAPSULE | Freq: Four times a day (QID) | ORAL | 0 refills | Status: AC

## 2023-06-04 NOTE — ED Triage Notes (Signed)
Pt to ED by POV from home c/o L middle finger injury. Pt endorses developing a blood blister that has been getting progressively worse over the past week. Bleeding is controlled in triage. Arrives A+O, VSS, NADN.

## 2023-06-04 NOTE — Discharge Instructions (Addendum)
You have been prescribed cephalexin. Take this antibiotic 4 times a day for the next 5 days. Take the full course of your antibiotic even if you start feeling better. Antibiotics may cause you to have diarrhea.   Please wear the splint provided.  Keep your finger clean, you may gently wash it with soap and water and apply an antibiotic ointment such as bacitracin or Neosporin to it.  Do not pick at your finger.  Follow-up with the hand office listed below as soon as possible for visit.  You must call them to schedule an appointment.

## 2023-06-04 NOTE — ED Notes (Signed)
Pt advised B/P was consistently high during visit. Pt educated on HTN risk factors, and diet. Pt provided with community resources for primary care and encouraged f/u.   Pt provided discharge instructions and prescription information. Pt was given the opportunity to ask questions and questions were answered.

## 2023-06-04 NOTE — ED Provider Notes (Signed)
[facility] EMERGENCY DEPARTMENT AT [facility] Provider Note   CSN: [id number] Arrival date & time: 06/04/23  1839     History  Chief Complaint  Patient presents with   Finger Injury    Left middle    Oscar Davila is a 27 y.o. male with no significant past medical history presenting with concern for a bump on his left middle finger.  He states about a month ago he noticed a blood blister there and had been messing with it.  He then notes over the past week it has become raised and with some clear fluid draining from it.  Denies any fevers or chills.  Denies any loss of sensation in the finger.  Denies any history of immunocompromise.  HPI     Home Medications Prior to Admission medications   Medication Sig Start Date End Date Taking? Authorizing Provider  cephALEXin (KEFLEX) 500 MG capsule Take 1 capsule (500 mg total) by mouth 4 (four) times daily for 5 days. 06/04/23 06/09/23 Yes Arabella Merles, PA-C  albuterol (PROVENTIL HFA;VENTOLIN HFA) 108 (90 Base) MCG/ACT inhaler Inhale 1-2 puffs into the lungs every 6 (six) hours as needed for wheezing or shortness of breath. 01/09/17   Liberty Handy, PA-C  cetirizine (ZYRTEC ALLERGY) 10 MG tablet Take 1 tablet (10 mg total) by mouth daily. 08/21/16   Danelle Berry, PA-C  loratadine (CLARITIN) 10 MG tablet Take 1 tablet (10 mg total) by mouth daily. 01/09/17   Liberty Handy, PA-C      Allergies    Patient has no known allergies.    Review of Systems   Review of Systems  Skin:  Positive for wound.    Physical Exam Updated Vital Signs BP (!) 154/102   Pulse 84   Temp 98.7 F (37.1 C) (Oral)   Resp 16   SpO2 100%  Physical Exam Vitals and nursing note reviewed.  Constitutional:      Appearance: Normal appearance.  HENT:     Head: Atraumatic.  Cardiovascular:     Rate and Rhythm: Normal rate and regular rhythm.  Pulmonary:     Effort: Pulmonary effort is normal.  Musculoskeletal:     Comments: Raised  firm erythematous area of skin over the left middle finger distal phalanx, some surrounded macerated skin and clear serous fluid drainage from the area.  Mildly tender to palpation No erythema or edema of the surrounding skin, no purulent drainage  No loss of sensation in the left fingertips, full range of motion of the left middle finger MCP, PIP, DIP  Neurological:     General: No focal deficit present.     Mental Status: He is alert.  Psychiatric:        Mood and Affect: Mood normal.        Behavior: Behavior normal.         ED Results / Procedures / Treatments   Labs (all labs ordered are listed, but only abnormal results are displayed) Labs Reviewed - No data to display  EKG None  Radiology No results found.  Procedures Procedures    Medications Ordered in ED Medications - No data to display  ED Course/ Medical Decision Making/ A&P                                 Medical Decision Making Risk Prescription drug management.   27 y.o. male with no significant past  medical history presents with concern for a bump on his left middle finger  Differential diagnosis includes but is not limited to laceration, hematoma, cellulitis  ED Course:  Patient has a firm erythematous raised area on his left middle finger distal phalanx.  It is tender to palpation and has some serous fluid draining out of it.  It is not fluctuant, low suspicion for abscess or hematoma.  Surrounding skin with no erythema or edema, low suspicion for cellulitis.  He has full range of motion and sensation of the finger.  No tachycardia or fever here today.  I discussed this case with Dr. Rhunette Croft and he also evaluated patient, we will start patient on a course of Keflex for any potential infection.  Have patient follow-up with hand surgery as soon as possible for further evaluation.  Patient finger wrapped with Xeroform dressing and placed in splint prior to discharge   Impression: Raised  erythematous area of skin over the left middle finger distal phalanx  Disposition:  The patient was discharged home with instructions to take course of Keflex, contact hand surgery office for an appointment as soon as possible for follow-up.  Continue wearing splint and keep the area clean Return precautions given.            Final Clinical Impression(s) / ED Diagnoses Final diagnoses:  Hematoma of left middle finger    Rx / DC Orders ED Discharge Orders          Ordered    cephALEXin (KEFLEX) 500 MG capsule  4 times daily        06/04/23 2044              Arabella Merles, PA-C 06/04/23 2144    Derwood Kaplan, MD 06/05/23 1126
